# Patient Record
Sex: Male | Born: 1960 | Race: White | Hispanic: No | Marital: Married | Smoking: Never smoker
Health system: Southern US, Community
[De-identification: ages and names within clinical notes are randomized; demographics above are authoritative.]

## PROBLEM LIST (undated history)

## (undated) DIAGNOSIS — G5622 Lesion of ulnar nerve, left upper limb: Secondary | ICD-10-CM

## (undated) DIAGNOSIS — Q873 Congenital malformation syndromes involving early overgrowth: Secondary | ICD-10-CM

## (undated) DIAGNOSIS — E785 Hyperlipidemia, unspecified: Secondary | ICD-10-CM

## (undated) DIAGNOSIS — I2510 Atherosclerotic heart disease of native coronary artery without angina pectoris: Secondary | ICD-10-CM

## (undated) DIAGNOSIS — I10 Essential (primary) hypertension: Secondary | ICD-10-CM

## (undated) DIAGNOSIS — G5600 Carpal tunnel syndrome, unspecified upper limb: Secondary | ICD-10-CM

## (undated) HISTORY — DX: Lesion of ulnar nerve, left upper limb: G56.22

## (undated) HISTORY — DX: Atherosclerotic heart disease of native coronary artery without angina pectoris: I25.10

## (undated) HISTORY — DX: Other disorders of bilirubin metabolism: E80.6

## (undated) HISTORY — DX: Carpal tunnel syndrome, unspecified upper limb: G56.00

## (undated) HISTORY — DX: Essential (primary) hypertension: I10

## (undated) HISTORY — PX: CAROTID STENT: SHX1301

## (undated) HISTORY — DX: Gilbert syndrome: E80.4

## (undated) HISTORY — DX: Congenital malformation syndromes involving early overgrowth: Q87.3

## (undated) HISTORY — DX: Hyperlipidemia, unspecified: E78.5

---

## 2003-12-27 ENCOUNTER — Inpatient Hospital Stay (HOSPITAL_COMMUNITY): Admission: EM | Admit: 2003-12-27 | Discharge: 2003-12-31 | Payer: Self-pay | Admitting: Emergency Medicine

## 2003-12-30 ENCOUNTER — Encounter (INDEPENDENT_AMBULATORY_CARE_PROVIDER_SITE_OTHER): Payer: Self-pay | Admitting: *Deleted

## 2004-01-20 ENCOUNTER — Encounter (HOSPITAL_COMMUNITY): Admission: RE | Admit: 2004-01-20 | Discharge: 2004-04-19 | Payer: Self-pay | Admitting: *Deleted

## 2004-06-11 ENCOUNTER — Ambulatory Visit: Payer: Self-pay | Admitting: Internal Medicine

## 2004-09-10 ENCOUNTER — Ambulatory Visit: Payer: Self-pay | Admitting: Cardiology

## 2004-09-19 ENCOUNTER — Emergency Department (HOSPITAL_COMMUNITY): Admission: EM | Admit: 2004-09-19 | Discharge: 2004-09-19 | Payer: Self-pay | Admitting: Emergency Medicine

## 2004-12-17 ENCOUNTER — Ambulatory Visit: Payer: Self-pay | Admitting: Internal Medicine

## 2005-03-15 ENCOUNTER — Ambulatory Visit: Payer: Self-pay | Admitting: Cardiology

## 2009-04-20 ENCOUNTER — Emergency Department (HOSPITAL_COMMUNITY): Admission: EM | Admit: 2009-04-20 | Discharge: 2009-04-20 | Payer: Self-pay | Admitting: Emergency Medicine

## 2009-06-03 ENCOUNTER — Ambulatory Visit (HOSPITAL_COMMUNITY): Admission: RE | Admit: 2009-06-03 | Discharge: 2009-06-03 | Payer: Self-pay | Admitting: General Surgery

## 2009-06-03 ENCOUNTER — Encounter (INDEPENDENT_AMBULATORY_CARE_PROVIDER_SITE_OTHER): Payer: Self-pay | Admitting: General Surgery

## 2010-11-19 LAB — CBC
HCT: 39.2 % (ref 39.0–52.0)
Hemoglobin: 13.7 g/dL (ref 13.0–17.0)
MCHC: 34.8 g/dL (ref 30.0–36.0)
MCV: 94.5 fL (ref 78.0–100.0)
Platelets: 183 10*3/uL (ref 150–400)
RBC: 4.15 MIL/uL — ABNORMAL LOW (ref 4.22–5.81)
RDW: 13.1 % (ref 11.5–15.5)
WBC: 6.8 10*3/uL (ref 4.0–10.5)

## 2010-11-19 LAB — DIFFERENTIAL
Basophils Absolute: 0 10*3/uL (ref 0.0–0.1)
Basophils Relative: 1 % (ref 0–1)
Eosinophils Absolute: 0.1 10*3/uL (ref 0.0–0.7)
Eosinophils Relative: 2 % (ref 0–5)
Lymphocytes Relative: 31 % (ref 12–46)
Lymphs Abs: 2.1 10*3/uL (ref 0.7–4.0)
Monocytes Absolute: 0.5 10*3/uL (ref 0.1–1.0)
Monocytes Relative: 8 % (ref 3–12)
Neutro Abs: 4 10*3/uL (ref 1.7–7.7)
Neutrophils Relative %: 58 % (ref 43–77)

## 2010-11-19 LAB — COMPREHENSIVE METABOLIC PANEL
ALT: 34 U/L (ref 0–53)
AST: 28 U/L (ref 0–37)
Albumin: 4.1 g/dL (ref 3.5–5.2)
Alkaline Phosphatase: 47 U/L (ref 39–117)
BUN: 14 mg/dL (ref 6–23)
CO2: 30 mEq/L (ref 19–32)
Calcium: 9.4 mg/dL (ref 8.4–10.5)
Chloride: 105 mEq/L (ref 96–112)
Creatinine, Ser: 1.09 mg/dL (ref 0.4–1.5)
GFR calc Af Amer: >60 mL/min (ref >60)
GFR calc non Af Amer: >60 mL/min (ref >60)
Glucose, Bld: 85 mg/dL (ref 70–99)
Potassium: 4.4 mEq/L (ref 3.5–5.1)
Sodium: 140 mEq/L (ref 135–145)
Total Bilirubin: 1.9 mg/dL — ABNORMAL HIGH (ref 0.3–1.2)
Total Protein: 6.2 g/dL (ref 6.0–8.3)

## 2010-11-20 LAB — DIFFERENTIAL
Basophils Absolute: 0 10*3/uL (ref 0.0–0.1)
Basophils Relative: 0 % (ref 0–1)
Eosinophils Absolute: 0 10*3/uL (ref 0.0–0.7)
Eosinophils Relative: 0 % (ref 0–5)
Lymphocytes Relative: 9 % — ABNORMAL LOW (ref 12–46)
Lymphs Abs: 1.1 10*3/uL (ref 0.7–4.0)
Monocytes Absolute: 0.4 10*3/uL (ref 0.1–1.0)
Monocytes Relative: 3 % (ref 3–12)
Neutro Abs: 11.1 10*3/uL — ABNORMAL HIGH (ref 1.7–7.7)
Neutrophils Relative %: 88 % — ABNORMAL HIGH (ref 43–77)

## 2010-11-20 LAB — COMPREHENSIVE METABOLIC PANEL
ALT: 41 U/L (ref 0–53)
AST: 30 U/L (ref 0–37)
Albumin: 4 g/dL (ref 3.5–5.2)
Alkaline Phosphatase: 44 U/L (ref 39–117)
BUN: 9 mg/dL (ref 6–23)
CO2: 25 mEq/L (ref 19–32)
Calcium: 9 mg/dL (ref 8.4–10.5)
Chloride: 103 mEq/L (ref 96–112)
Creatinine, Ser: 0.9 mg/dL (ref 0.4–1.5)
GFR calc Af Amer: >60 mL/min (ref >60)
GFR calc non Af Amer: >60 mL/min (ref >60)
Glucose, Bld: 125 mg/dL — ABNORMAL HIGH (ref 70–99)
Potassium: 3.4 mEq/L — ABNORMAL LOW (ref 3.5–5.1)
Sodium: 138 mEq/L (ref 135–145)
Total Bilirubin: 2 mg/dL — ABNORMAL HIGH (ref 0.3–1.2)
Total Protein: 6.8 g/dL (ref 6.0–8.3)

## 2010-11-20 LAB — CBC
HCT: 42.4 % (ref 39.0–52.0)
Hemoglobin: 14.4 g/dL (ref 13.0–17.0)
MCHC: 34.1 g/dL (ref 30.0–36.0)
MCV: 94.4 fL (ref 78.0–100.0)
Platelets: 200 10*3/uL (ref 150–400)
RBC: 4.49 MIL/uL (ref 4.22–5.81)
RDW: 12.9 % (ref 11.5–15.5)
WBC: 12.6 10*3/uL — ABNORMAL HIGH (ref 4.0–10.5)

## 2010-11-20 LAB — URINE MICROSCOPIC-ADD ON

## 2010-11-20 LAB — URINALYSIS, ROUTINE W REFLEX MICROSCOPIC
Bilirubin Urine: NEGATIVE
Glucose, UA: NEGATIVE mg/dL
Ketones, ur: NEGATIVE mg/dL
Leukocytes, UA: NEGATIVE
Nitrite: NEGATIVE
Protein, ur: NEGATIVE mg/dL
Specific Gravity, Urine: 1.018 (ref 1.005–1.030)
Urobilinogen, UA: 0.2 mg/dL (ref 0.0–1.0)
pH: 5 (ref 5.0–8.0)

## 2010-11-20 LAB — LIPASE, BLOOD: Lipase: 18 U/L (ref 11–59)

## 2010-11-23 IMAGING — CT CT ABDOMEN W/ CM
2 of 5 series · 17 of 46 positions shown, 19 images · IV contrast (WATER & 120 ML OMNI 300)
Comparison: None

CT ABDOMEN

CLINICAL DATA: Mid abdominal pain

CT ABDOMEN AND PELVIS WITH CONTRAST
TECHNIQUE: Multidetector CT imaging of the abdomen and pelvis was
performed using the standard protocol following bolus
administration of intravenous contrast.
Contrast: 120 ml of Omnipaque

[Series 2: routine abdomen · axial · 0.83mm/px · z∈[-486,-36]mm · 14 of 102 slices shown, 16 images]
[im 6/102  soft-tissue]
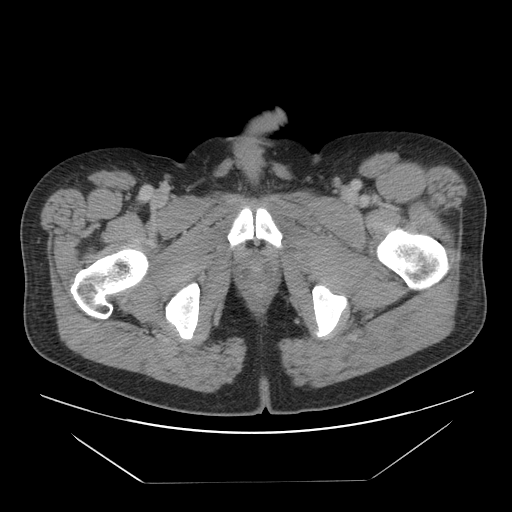
[im 6/102  bone]
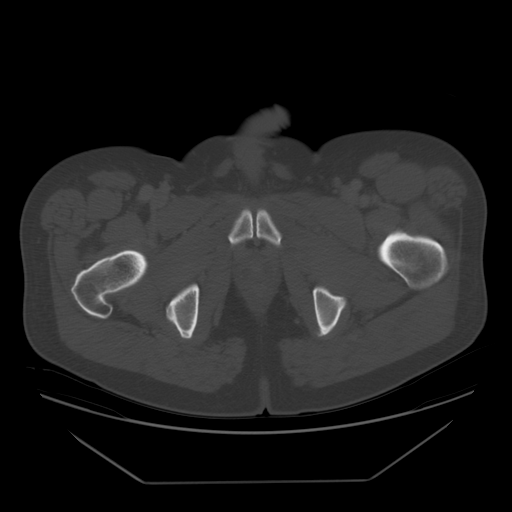
[im 11/102  soft-tissue]
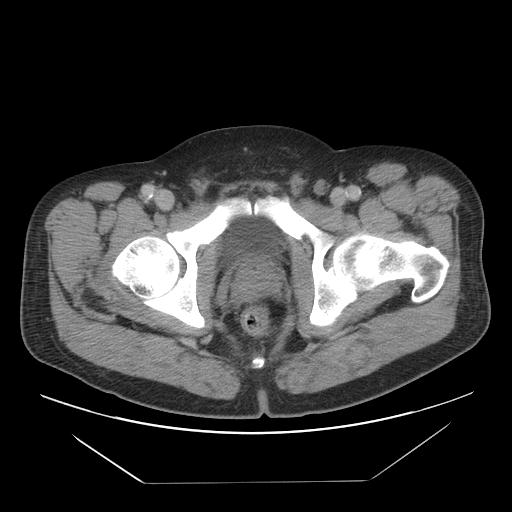
[im 22/102  soft-tissue]
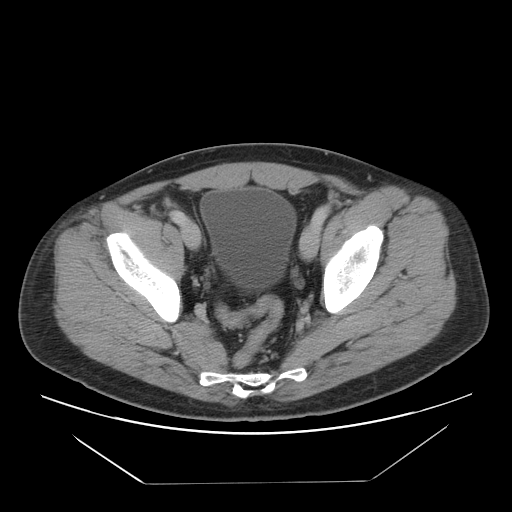
[im 27/102  soft-tissue]
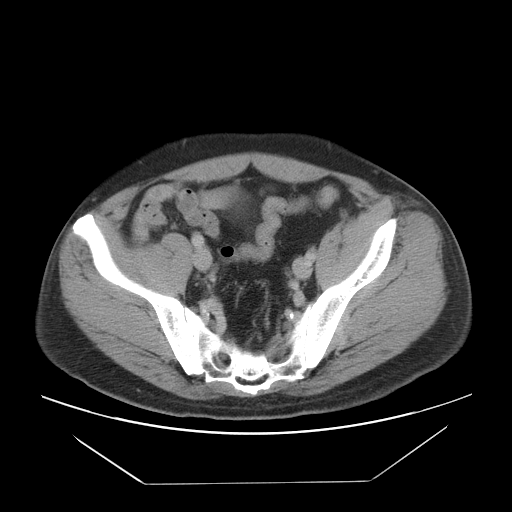
[im 32/102  soft-tissue]
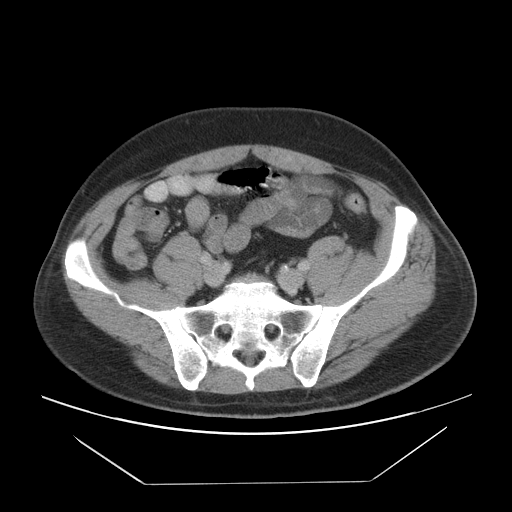
[im 43/102  soft-tissue]
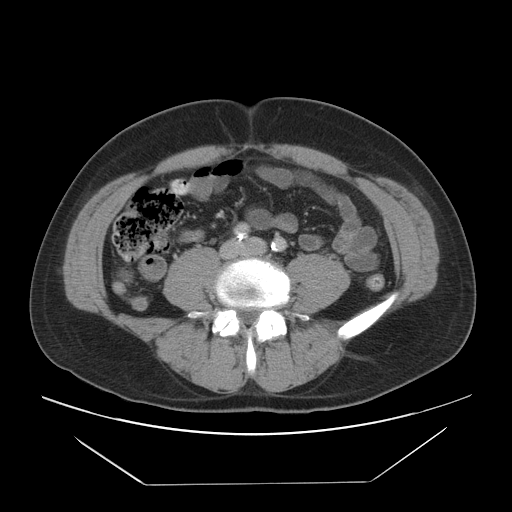
[im 48/102  soft-tissue]
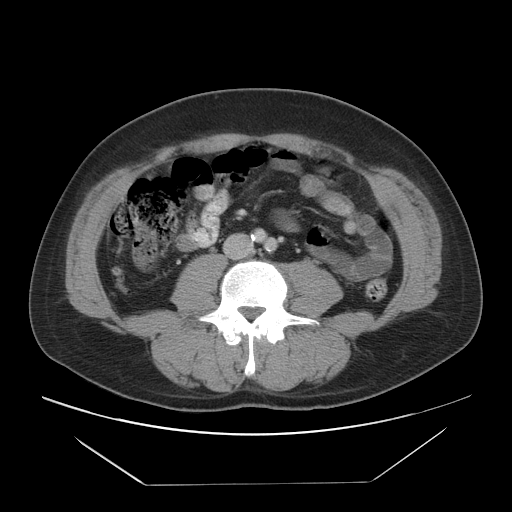
[im 54/102  soft-tissue]
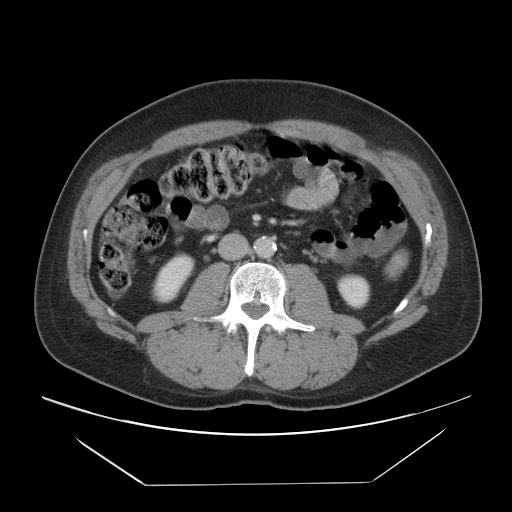
[im 59/102  soft-tissue]
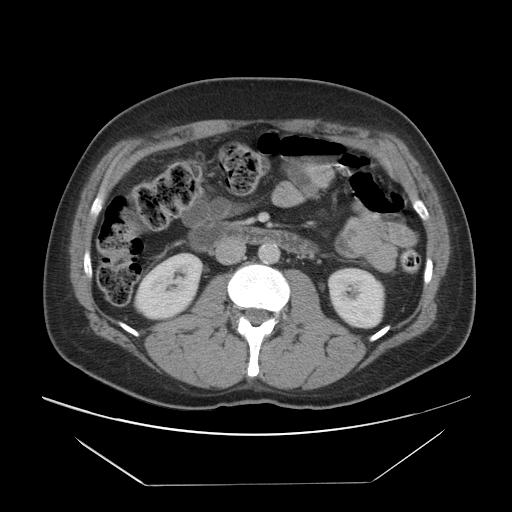
[im 59/102  bone]
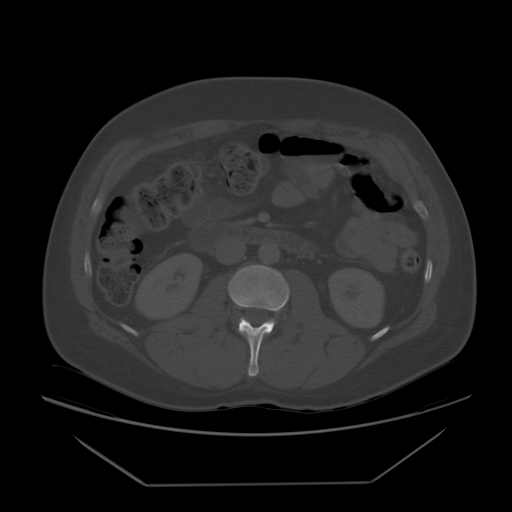
[im 70/102  soft-tissue]
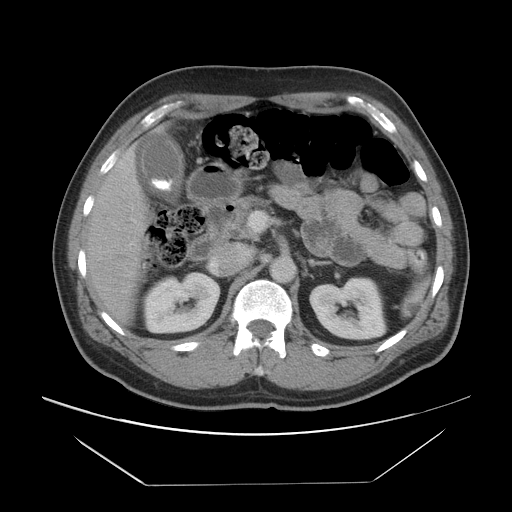
[im 75/102  soft-tissue]
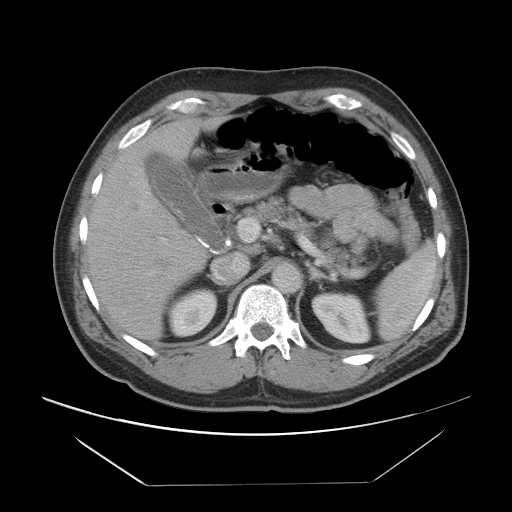
[im 80/102  soft-tissue]
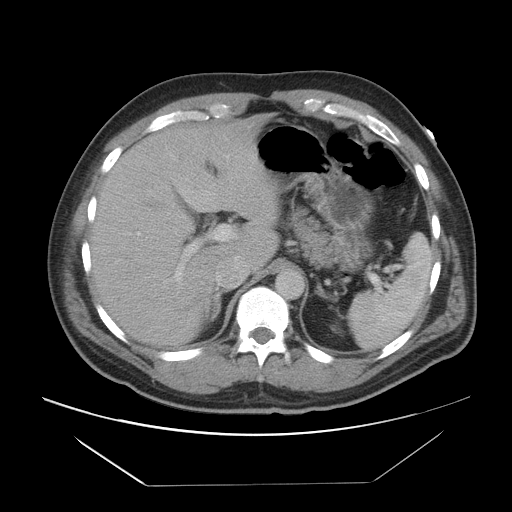
[im 91/102  soft-tissue]
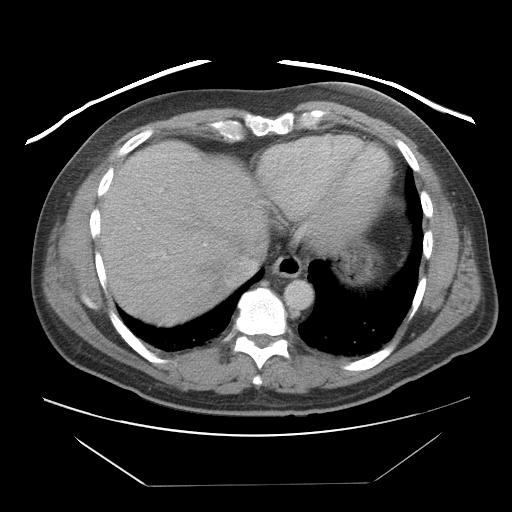
[im 96/102  soft-tissue]
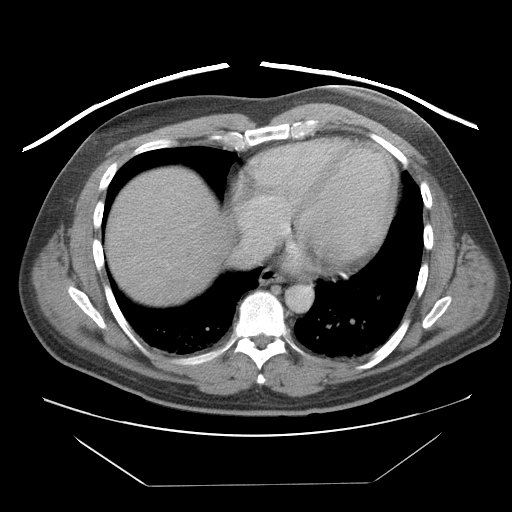

[Series 400: cor · coronal · 1.05mm/px · 3 of 107 slices shown]
[im 36/107  soft-tissue]
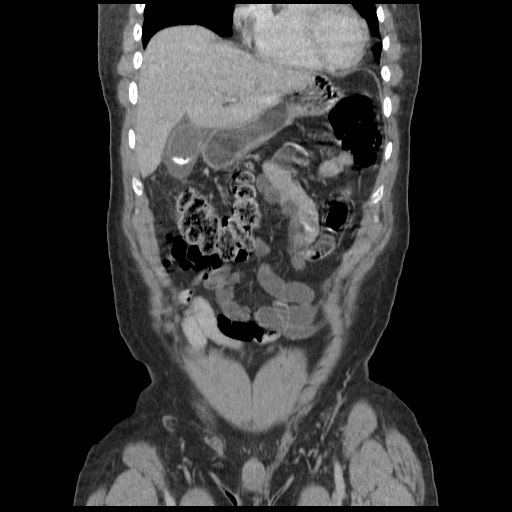
[im 48/107  soft-tissue]
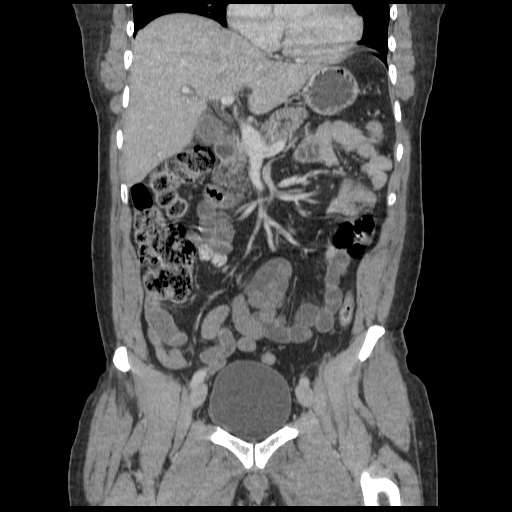
[im 59/107  soft-tissue]
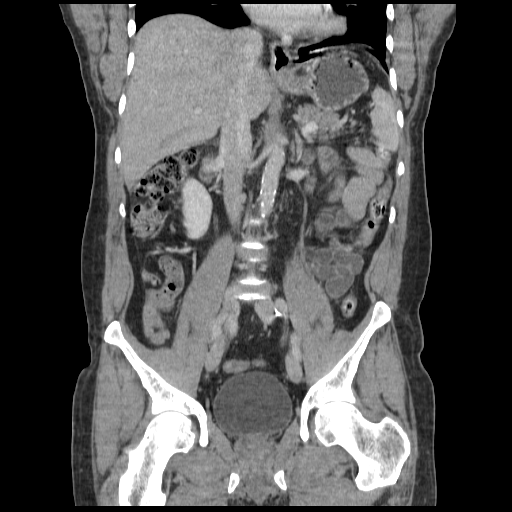

[17 of 46 positions shown; findings below may reference images not displayed]

FINDINGS: Lung bases are unremarkable.  Mild degenerative changes
lumbar spine.

Liver, spleen, pancreas and adrenals are unremarkable.  Mild
atherosclerotic calcifications of the abdominal aorta and iliac
arteries are noted.

Multiple calcified gallstones are noted within gallbladder.  A
calcified gallstone in the gallbladder neck region measures 4.3 mm.
There is significant thickening of the gallbladder wall up to
mm in thickness.  Mild enhancement of the gallbladder wall is
noted.  Findings are highly suspicious for calculous cholecystitis.
Clinical correlation is necessary.

No bowel obstruction.  No ascites or free air.  No adenopathy.

No hydronephrosis or hydroureter.  Kidneys show symmetrical size
and enhancement.  No focal renal mass.
IMPRESSION: 1.  Multiple calcified gallstones are noted within gallbladder.
There is thickening of the gallbladder wall.  A calcified gallstone
is noted in gallbladder neck region.  Findings are highly
suspicious for  calculous cholecystitis. Clinical correlation is
necessary.
2.  No hydronephrosis or hydroureter.
3.  Mild degenerative changes lumbar spine.

CT PELVIS
FINDINGS: A normal appendix is clearly visualized in axial image
56.  No pelvic ascites or adenopathy.  The urinary bladder is
unremarkable.  Bilateral distal ureter is unremarkable.  No
inguinal adenopathy.  No destructive bony lesions are seen within
pelvis.
IMPRESSION: 1.  Normal appendix.
2.  Normal urinary bladder.
3.  No pelvic ascites or adenopathy.

## 2011-01-01 IMAGING — CR DG CHEST 2V
2 series · 2 of 2 positions shown · non-contrast
Comparison: 12/27/2003

CLINICAL DATA: Preoperative evaluation for symptomatic
cholelithiasis.  Hypertension.  Nonsmoker history of stent
placement 5 years ago.

CHEST - 2 VIEW

[view not recorded (1 of 2)]
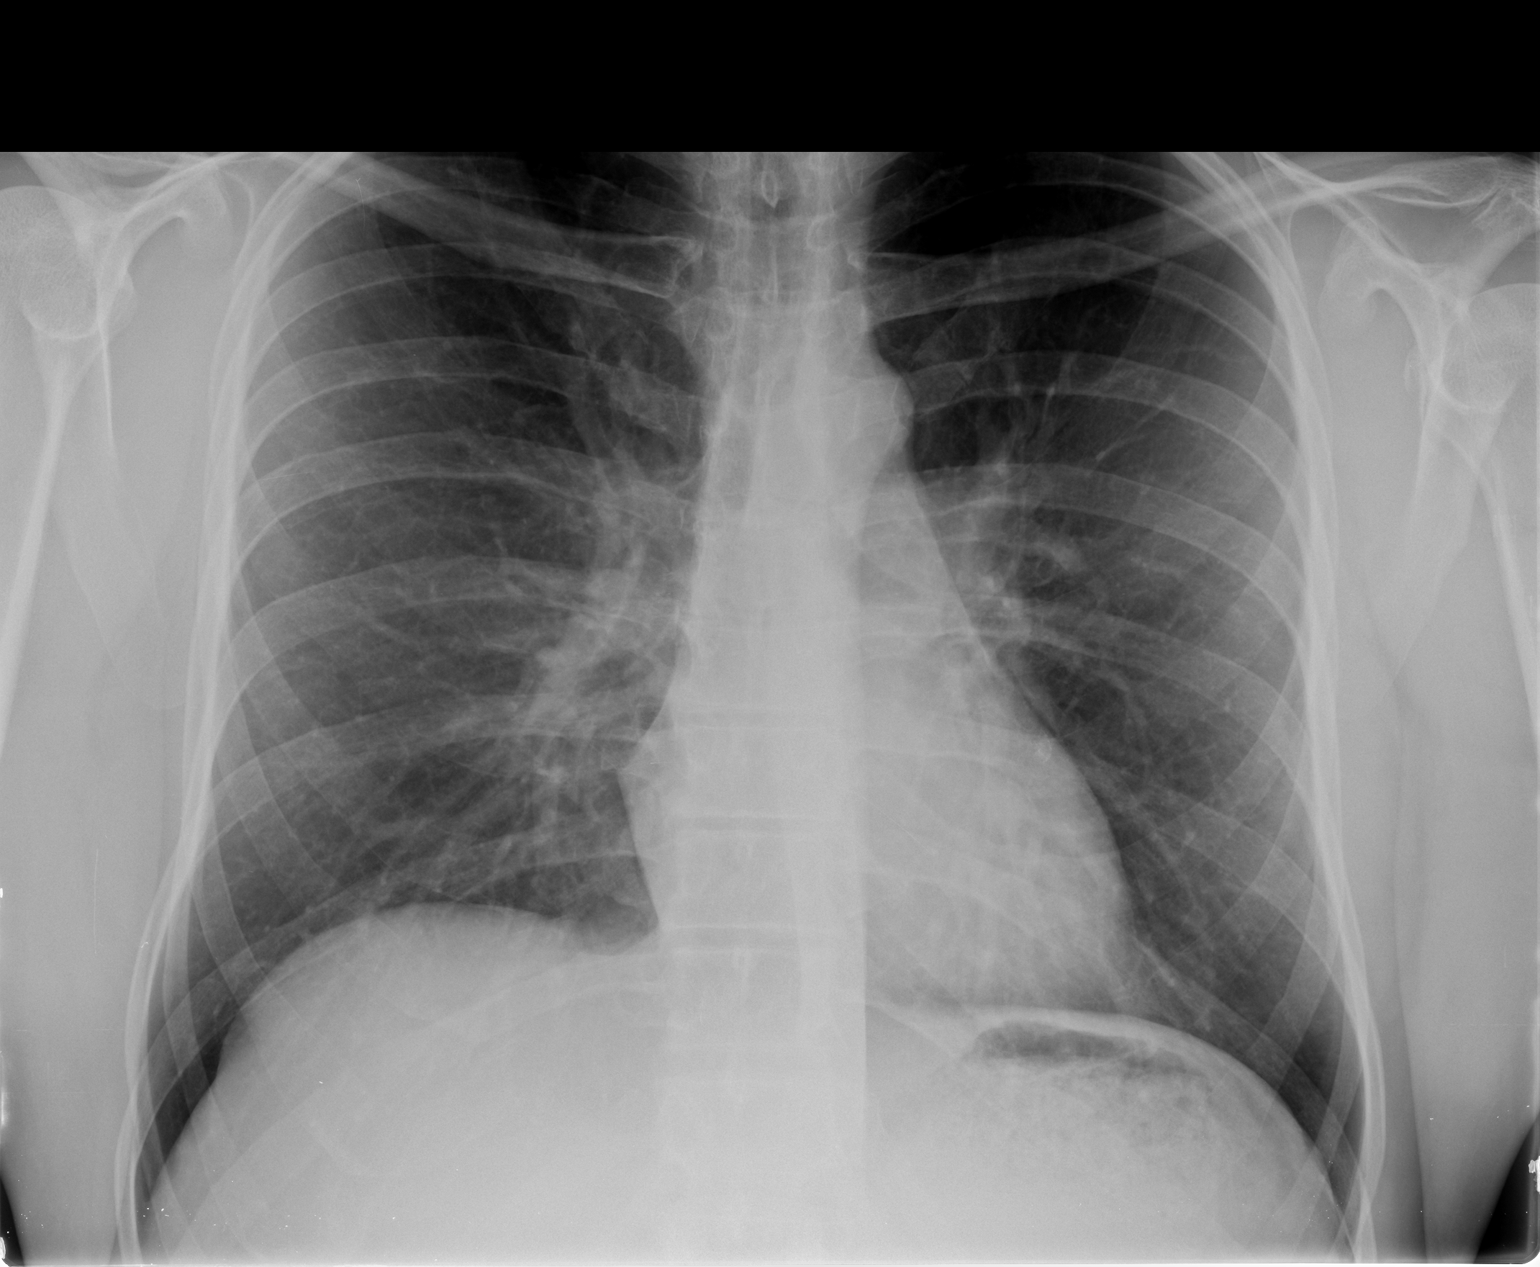

[view not recorded (2 of 2)]
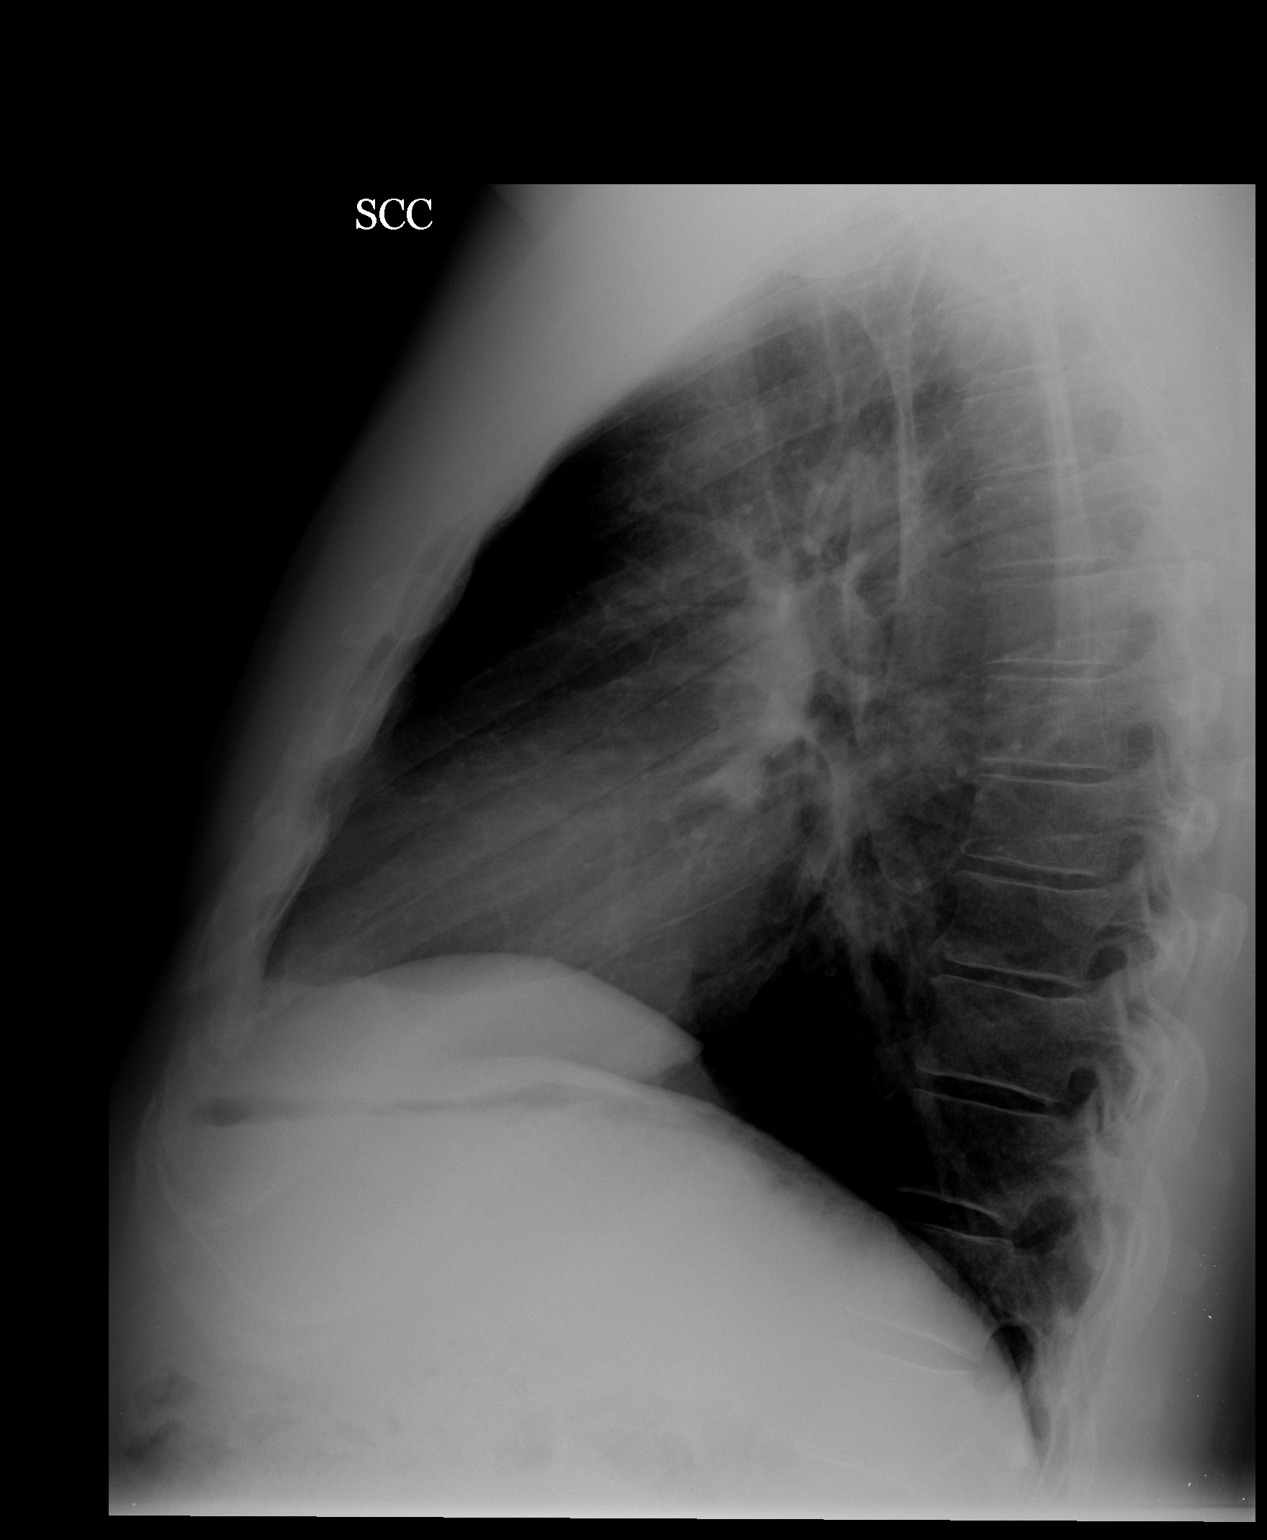

[2 of 2 positions shown; findings below may reference images not displayed]

FINDINGS: Heart and mediastinal contours are within normal limits.
The lung fields appear clear with no evidence for focal infiltrate
or congestive failure.  No pleural fluid is seen.  Bony structures
demonstrate mild degenerative changes of the lower lumbar spine and
are otherwise intact.
IMPRESSION: No acute cardiopulmonary abnormality noted.

## 2011-01-01 NOTE — Cardiovascular Report (Signed)
Francisco Randolph, Francisco Randolph NO.:  [id number]   MEDICAL RECORD NO.:  [id number]                   PATIENT TYPE:  INP   LOCATION:  2931                                 FACILITY:  [facility]   PHYSICIAN:  Meade Maw, M.D.                 DATE OF BIRTH:  30-Dec-1960   DATE OF PROCEDURE:  12/27/2003  DATE OF DISCHARGE:                              CARDIAC CATHETERIZATION   INDICATION FOR PROCEDURE:  Acute anteroapical myocardial infarction.   PROCEDURE:  The patient was brought emergently to the cardiac  catheterization lab.  Both groins were prepped and draped in the usual  sterile fashion.  Local anesthesia was achieved using 1% Xylocaine.  A 7  French hemostasis sheath was placed into the right femoral artery using the  modified Seldinger technique.  Selective coronary angiography was performed  using a JL-4, JR-4 Judkins catheter.  Multiple views were obtained.  All  catheter exchanges were made over guide wire.  Single plane ventriculogram  was performed in the RAO position using a 6 French pigtail curved catheter.  The patient was double bolused with integrilin.  ACT was obtained.  Dr.  Verdis Prime was consulted.  Films were reviewed.  Dr. Katrinka Blazing proceeded with  emergent percutaneous revascularization of the left anterior descending.   FINDINGS:  1. Aortic pressure was 141/89.  2. LV pressure 144/17.  3. EDP 34.  4. Single plane ventriculogram revealed anterior apical akinesis.  Estimated     ejection fraction 45%.   CORONARY ANGIOGRAPHY:  1. The left main coronary artery bifurcates.  There is no disease noted in     the left main coronary artery.  2. LAD:  LAD gives rise to two moderate size septal perforators.  It is then     100% totally occluded.  3. Circumflex vessel:  Circumflex vessel gives rise to a trivial OM-1,     trivial OM-2 and a large trifurcating OM-3.  It then goes on in as an AV     groove vessel.  There is luminal irregularities only in  the circumflex.  4. Right coronary artery:  Right coronary artery demonstrates diffuse     disease.  There is mild ectasia noted.  The most severe stenosis is noted     in the proximal and mid distal up to 30%.   FINAL IMPRESSION:  1. Acute anteroapical infarct.  Estimated ejection fraction 45%.  2. Total occlusion of the mid left anterior descending.  3. Diffuse disease in the right coronary artery of up to 30%.   RECOMMENDATIONS:  Percutaneous revascularization.                                               Meade Maw, M.D.    HP/MEDQ  D:  12/27/2003  T:  12/28/2003  Job:  [id number]

## 2011-01-01 NOTE — Cardiovascular Report (Signed)
NAMEFOUNTAIN, DERUSHA NO.:  [id number]   MEDICAL RECORD NO.:  [id number]                   PATIENT TYPE:  INP   LOCATION:  2931                                 FACILITY:  [facility]   PHYSICIAN:  Lesleigh Noe, M.D.            DATE OF BIRTH:  06/02/1961   DATE OF PROCEDURE:  12/27/2003  DATE OF DISCHARGE:                              CARDIAC CATHETERIZATION   INDICATIONS FOR PROCEDURE:  Acute anterior myocardial infarction with  totally occluded LAD.   DATE OF PROCEDURE:  Dec 27, 2003.   PROCEDURE PERFORMED:  LAD stent with Taxus drug-eluting stent.   DESCRIPTION:  Following Dr. Lindell Spar diagnostic evaluation during acute  infarction, a 7 French 3.5 cm left Judkins catheter was used to obtain  guiding support and shots.  A #4 left Judkins would not maintain ostium  intubation.  We tried at least 7 or 8 other catheters, but the 3.5 Judkins  left catheter was the best at maintaining some level of support to a wire to  allow catheter movement up and down the artery.  The patient received a  double bolus followed by an infusion of integrilin and also was given  approximately 7000 units of IV heparin during the procedure.  ACT was  documented during the procedure to be 240 seconds or greater and at the  completion of the procedure was 234 seconds.  We had difficulty crossing the  stenosis with guide wires because of significant angulation within the total  occlusion.  Wire manipulation led to reperfusion at around 2 p.m.  From that  point, we had still some difficulty with crossing the stenosis, but were  ultimately successful using an [employer] medium wire.  We also used an Marketing executive. Predilatation was performed with a 2.5 x 15-mm Maverick  balloon to 10 atmospheres.  We then deployed at 16 mm long x 3.0 mm Taxus  stent to 15 atmospheres.  Two inflations were performed.  We then performed  post dilatation with a 12 mm long x 3.25 mm Quantum to 14  atmospheres.  The  100% stenosis was reduced to 0% with TIMI-3 flow.  A diagonal involved in  the stenosis remained patent.  ACT post procedure 234 seconds.   A right femoral sheathogram was performed and Angio-Seal arteriotomy closure  was performed without difficulty.   CONCLUSION:  Successful stent of the mid left anterior descending with  reduction of stenosis from 100% to 0% with reestablishment of TIMI_3 flow.   PLAN:  Aspirin, Plavix or study drug.  The patient is enrolled in the [place]  study.  integrilin x18 hours.                                               Lesleigh Noe, M.D.  HWS/MEDQ  D:  12/27/2003  T:  12/28/2003  Job:  [id number]

## 2011-01-01 NOTE — Discharge Summary (Signed)
NAMERIGGS, Francisco NO.:  Randolph   MEDICAL RECORD NO.:  [id number]                   PATIENT TYPE:  INP   LOCATION:  4703                                 FACILITY:  [facility]   PHYSICIAN:  Meade Maw, M.D.                 DATE OF BIRTH:  1961-05-02   DATE OF ADMISSION:  12/27/2003  DATE OF DISCHARGE:  12/31/2003                                 DISCHARGE SUMMARY   HISTORY OF PRESENT ILLNESS/REASON FOR ADMISSION:  Francisco Randolph is a 50-year-  old male patient from [place] who began experiencing anterior chest pain  while mowing the grass two days prior to arrival.  He had another episode of  chest pain today while driving in his car described as a pressure sensation.  The previous day, he had actually mowed the grass without any additional  chest pain symptoms, and he works out regularly without any chest pain.  When he arrived at the ER, his chest pain was 3/10.  He was given sublingual  nitroglycerin and the pain went down to 0/10.  He recently moved to the  [place] area about two months ago from [place].  His primary care  physician is located in [place].   PAST MEDICAL HISTORY:  Significant for dyslipidemia, and he is currently on  Lipitor at home.   PHYSICAL EXAMINATION:  VITAL SIGNS:  Initial vital signs showed elevated BP  of 160/100, repeat was down to 129/87.  Heart rate 90, down to 82.  Respirations 18.  Saturations 94% on two liters.  Initial EKG showed sinus  rhythm with inferior and anterior ST segment elevation, 1-2 mm in 2, 3, aVF,  V2 to V4.  Physical examination was unremarkable.   LABORATORY DATA:  Chest x-ray and labs are pending at time of initial H&P.   ADMISSION DIAGNOSES:  The patient was admitted with the following diagnoses:  1. Acute anteroapical myocardial infarction, to catheterization lab     emergently.  2. Dyslipidemia.   HOSPITAL COURSE:  Problem #1.  Chest pain, acute anteroapical myocardial  infarction.  The patient was taken emergently to the catheterization lab on  Dec 27, 2003, per Dr. Fraser Din, at which time she found anterior apical  akinesis with an ejection fraction of 45%.  LAD was 100% occluded after the  diagonal 2, the circumflex 20, RCA showed mild ectasia, diffuse disease up  to 30% proximal mid vessel.  Films were reviewed by Dr. Katrinka Blazing, and the  patient on the same day underwent PCI with Taxus stent placement to the mid  LAD with subsequent TIMI 3 flow, obstruction down to 0%.  He was given  heparin and Integrilin in the immediate post-catheterization period.  Dr.  Kimberlee Nearing the patient to use the Triton study drug, and this was  initiated in lieu of Plavix.  The patient did have some episodes of chest  pressure without EKG changes in the  immediate post intervention period.  On  May 14, he was changed from Lopressor to [employer].  Altace was started at low-  dose, and homocystine level was checked.  Zocor was continued for his  dyslipidemia.  This was formulary for this facility.   Subsequent isoenzymes peaked at the following __________ 81.9, CK 2791, and  MB 274.8.  The following day, the patient remained stable, in sinus rhythm  without further episodes of chest pain.  Coreg was gently titrated up.  The  patient's lipid panel returned with a cholesterol of 231, triglycerides  112, LDL cholesterol highly elevated at 171, HDL suboptimal at 38.  Zetia  was added per Dr. Fraser Din.  Cardiac rehabilitation was started.  The patient  ambulated without problems.  No further chest pain or nausea.  A post-  catheterization 2-D echocardiogram was completed on Dec 30, 2003.  Unfortunately, I am unable to locate a copy of the echocardiogram in the  medical record chart.   Other problems the patient was having included mild elevated bilirubin.  This has been a chronic problem for 10 years.  Dr. Fraser Din recommend  recommended that the patient follow with his primary care  physician for  this.  He also had mild elevation in his homocystine level.  The homocystine  level was 13.91.  Because of his elevated bilirubin levels, Dr. Fraser Din was  reluctant to start Foltx on him at this point.   By Dec 31, 2003, the patient was quite stable.  Blood pressure was a little  low at 99/62.  Heart rate 79.  Some additional titration of Coreg and ACE  inhibitor were put on hold at this point.  Dr. Fraser Din otherwise deemed the  patient appropriate for discharge home with outpatient cardiac  rehabilitation followup.   FINAL DISCHARGE DIAGNOSES:  1. Acute anteroapical myocardial infarction status post Taxus stent mid left     anterior descending.  2. Dyslipidemia with suboptimal HDL and elevated LDL.  3. Ischemic cardiomyopathy with ejection fraction of 30 to 40%.  4. Mild elevation of homocystine level.  5. 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   DISCHARGE MEDICATIONS:  1. Triton study drug, no Plavix until cleared by study team.  2. Aspirin 325 mg daily.  3. Altace 2.5 mg daily.  4. Lipitor 40 mg daily at 6 p.m.  This is a new dose compared to prior to     admission.  5. Zetia 10 mg daily with Lipitor.  6. Coreg 6.25 mg b.i.d.  He has been given 12.5 mg tablets and been     instructed to take half of these twice daily.   DISCHARGE ACTIVITY:  1. No driving for the next two weeks.  2. Out of work for a total of six weeks from admission to the     hospitalization.   DISCHARGE DIET:  1. Cardiac, low-fat, low-salt, 2000 mg per day.  2. Low cholesterol.   FOLLOWUP:  1. He is to see Dr. Fraser Din on Tuesday, May 31, at 2:30 p.m.  2. He is to have liver function tests done on June 14, any time after 7:30     a.m.  3. He is to have a fasting statin panel done on June 28 at 7:30 a.m.  4. He is also to follow with his primary care physician about his elevated     bilirubin level.     Francisco Randolph, N.P.  Meade Maw, M.D.    ALE/MEDQ  D:   02/04/2004  T:  02/06/2004  Job:  [id number]

## 2014-10-25 ENCOUNTER — Encounter: Payer: Self-pay | Admitting: *Deleted

## 2014-10-30 ENCOUNTER — Ambulatory Visit (HOSPITAL_COMMUNITY): Payer: BLUE CROSS/BLUE SHIELD | Attending: Cardiovascular Disease | Admitting: *Deleted

## 2014-10-30 ENCOUNTER — Ambulatory Visit (INDEPENDENT_AMBULATORY_CARE_PROVIDER_SITE_OTHER): Payer: BLUE CROSS/BLUE SHIELD | Admitting: Interventional Cardiology

## 2014-10-30 ENCOUNTER — Encounter: Payer: Self-pay | Admitting: Interventional Cardiology

## 2014-10-30 VITALS — BP 124/70 | HR 54 | Ht 68.0 in | Wt 209.0 lb

## 2014-10-30 DIAGNOSIS — I2510 Atherosclerotic heart disease of native coronary artery without angina pectoris: Secondary | ICD-10-CM

## 2014-10-30 DIAGNOSIS — M7989 Other specified soft tissue disorders: Secondary | ICD-10-CM

## 2014-10-30 DIAGNOSIS — I252 Old myocardial infarction: Secondary | ICD-10-CM

## 2014-10-30 DIAGNOSIS — E785 Hyperlipidemia, unspecified: Secondary | ICD-10-CM

## 2014-10-30 MED ORDER — CLOPIDOGREL BISULFATE 75 MG PO TABS: 75.0000 mg | ORAL_TABLET | Freq: Every day | ORAL | Status: DC

## 2014-10-30 NOTE — Patient Instructions (Addendum)
Your physician has recommended you make the following change in your medication:  1)STOP taking Aspirin 2) START taking Plavix 75mg  daily   Your physician has requested that you have a lower extremity arterial exercise duplex. During this test, exercise and ultrasound are used to evaluate arterial blood flow in the legs. Allow one hour for this exam. There are no restrictions or special instructions.  Your physician wants you to follow-up in: 1 year with Dr. Eldridge DaceVaranasi. You will receive a reminder letter in the mail two months in advance. If you don't receive a letter, please call our office to schedule the follow-up appointment.

## 2014-10-30 NOTE — Progress Notes (Signed)
Patient ID: Francisco Randolph, male   DOB: [birth year], 54 y.o.   MRN: [id number]     Cardiology Office Note   Date:  10/30/2014   ID:  Francisco Randolph, DOB 1961-07-09, MRN [id number]  PCP:  Georgann Housekeeper, MD  Cardiologist:   Corky Crafts., MD   No chief complaint on file.  F/U CAD   History of Present Illness: Francisco Randolph is a 54 y.o. male who presents for CAD f/u.  He had an LAD drug  stent to the LAD in 2005 in the setting of an anterior MI.  He has done well since that time.  He had a stress test in 2009 which shoed some apical WMA and some mild periinfarct ischemia.  He has been off of Plavix fo rseveral years, tolerating aspirin.  He has been taking CoQ10.    He tries to walk or run.  He plays tennis and does weight training as well.  He does a lot of core exercises and stretching. He has improved on his diet.  He has lost 20 lbs in the past year.    He has noticed some right ankle swelling of late.  He had a right knee injury and then noticed the right calf swelling.  No long car rides or plane trips.    Past Medical History  Diagnosis Date  . CAD (coronary artery disease)   . HTN (hypertension)   . Dyslipidemia   . [place] Dec Agers syndrome   . CTS (carpal tunnel syndrome)   . Hyperbilirubinemia   . Gilbert's syndrome   . Entrapment of left ulnar nerve     Past Surgical History  Procedure Laterality Date  . Carotid stent       Current Outpatient Prescriptions  Medication Sig Dispense Refill  . atorvastatin (LIPITOR) 80 MG tablet Take 80 mg by mouth daily.    . carvedilol (COREG) 12.5 MG tablet Take 12.5 mg by mouth 2 (two) times daily with a meal.    . Coenzyme Q10 (CO Q-10) 100 MG CAPS Take 1 tablet by mouth daily.    . ramipril (ALTACE) 10 MG capsule Take 10 mg by mouth daily.    . clopidogrel (PLAVIX) 75 MG tablet Take 1 tablet (75 mg total) by mouth daily. 30 tablet 11   No current facility-administered medications for this visit.    Allergies:    Review of patient's allergies indicates no known allergies.    Social History:  The patient  reports that he has never smoked. He does not have any smokeless tobacco history on file.   Family History:  The patient's *family history is not on file.    ROS:  Please see the history of present illness.   Otherwise, review of systems are positive for right leg selling.   All other systems are reviewed and negative.    PHYSICAL EXAM: VS:  BP 124/70 mmHg  Pulse 54  Ht  (1.727 m)  Wt 209 lb (94.802 kg)  BMI 31.79 kg/m2 , BMI Body mass index is 31.79 kg/(m^2). GEN: Well nourished, well developed, in no acute distress HEENT: normal Neck: no JVD, carotid bruits, or masses Cardiac: RRR; no murmurs, rubs, or gallops,no edema  Respiratory:  clear to auscultation bilaterally, normal work of breathing GI: soft, nontender, nondistended, + BS MS: no deformity or atrophy Skin: warm and dry, no rash, 1+ right pretibial and right ankle swelling Neuro:  Strength and sensation are intact Psych: euthymic mood, full affect   EKG:  EKG  is ordered today. The ekg ordered today demonstrates NSR, poor R wave progression   Recent Labs: No results found for requested labs within last 365 days.    Lipid Panel No results found for: CHOL, TRIG, HDL, CHOLHDL, VLDL, LDLCALC, LDLDIRECT    Wt Readings from Last 3 Encounters:  10/30/14 209 lb (94.802 kg)      Other studies Reviewed: Additional studies/ records that were reviewed today include: Office visit from Dr. Donette LarryHusain. Review of the above records demonstrates: No active cardiac issues   ASSESSMENT AND PLAN:  1.  CAD/history of anterior MI: He is doing well from a cardiac standpoint. Given that he had a first generation drug-eluting stent placed in the setting of an MI, will change his aspirin to clopidogrel 75 mg daily.  He has not had any bleeding problems.  2.  Decreased LV function noted on prior nuclear stress test. He is on ACE inhibitor  and beta blocker. He appears euvolemic. No symptoms of CHF.  3.  Hyperlipidemia: I don't have his most recent lipids. He says his LDL has been around 100. Ideally, would get his LDL to around 70. Consider adding Zetia 10 mg daily if his most recent lifestyle changes do not bring his LDL down to 70.  4.  Right leg swelling: This is an acute issue. Will check lower extremity Doppler to rule out DVT. Obviously, if he does have a DVT, this would change our strategy regarding the aspirin and Plavix.   Current medicines are reviewed at length with the patient today.  The patient does not have concerns regarding medicines.  The following changes have been made:  Change aspirin to Plavix  Labs/ tests ordered today include: Right lower extremity venous Doppler   Orders Placed This Encounter  Procedures  . EKG 12-Lead  . Lower Extremity Arterial Doppler Right     Disposition:   FU with me in one year   Delorise JacksonSigned, Dabney Dever S., MD  10/30/2014 11:12 AM    [facility] [address], [place], [place]  [address] Phone: [phone]; Fax: [phone]

## 2014-10-30 NOTE — Progress Notes (Signed)
Unilateral Lower Extremity Venous Duplex Exam - Right - Performed

## 2014-11-14 ENCOUNTER — Encounter: Payer: Self-pay | Admitting: *Deleted

## 2015-04-04 ENCOUNTER — Other Ambulatory Visit: Payer: Self-pay | Admitting: *Deleted

## 2015-04-04 MED ORDER — CLOPIDOGREL BISULFATE 75 MG PO TABS: 75.0000 mg | ORAL_TABLET | Freq: Every day | ORAL | Status: AC

## 2018-01-04 DIAGNOSIS — Z0000 Encounter for general adult medical examination without abnormal findings: Secondary | ICD-10-CM | POA: Diagnosis not present

## 2018-01-05 DIAGNOSIS — Z125 Encounter for screening for malignant neoplasm of prostate: Secondary | ICD-10-CM | POA: Diagnosis not present

## 2018-01-05 DIAGNOSIS — Z0000 Encounter for general adult medical examination without abnormal findings: Secondary | ICD-10-CM | POA: Diagnosis not present

## 2018-09-01 DIAGNOSIS — R7309 Other abnormal glucose: Secondary | ICD-10-CM | POA: Diagnosis not present

## 2018-09-01 DIAGNOSIS — Z0000 Encounter for general adult medical examination without abnormal findings: Secondary | ICD-10-CM | POA: Diagnosis not present

## 2018-09-01 DIAGNOSIS — E7800 Pure hypercholesterolemia, unspecified: Secondary | ICD-10-CM | POA: Diagnosis not present

## 2018-09-14 DIAGNOSIS — L918 Other hypertrophic disorders of the skin: Secondary | ICD-10-CM | POA: Diagnosis not present

## 2018-09-14 DIAGNOSIS — D2261 Melanocytic nevi of right upper limb, including shoulder: Secondary | ICD-10-CM | POA: Diagnosis not present

## 2018-09-14 DIAGNOSIS — D1801 Hemangioma of skin and subcutaneous tissue: Secondary | ICD-10-CM | POA: Diagnosis not present

## 2018-09-14 DIAGNOSIS — L821 Other seborrheic keratosis: Secondary | ICD-10-CM | POA: Diagnosis not present

## 2019-02-28 DIAGNOSIS — E7800 Pure hypercholesterolemia, unspecified: Secondary | ICD-10-CM | POA: Diagnosis not present

## 2019-02-28 DIAGNOSIS — Z131 Encounter for screening for diabetes mellitus: Secondary | ICD-10-CM | POA: Diagnosis not present

## 2019-02-28 DIAGNOSIS — I10 Essential (primary) hypertension: Secondary | ICD-10-CM | POA: Diagnosis not present

## 2019-02-28 DIAGNOSIS — I252 Old myocardial infarction: Secondary | ICD-10-CM | POA: Diagnosis not present

## 2019-02-28 DIAGNOSIS — Z1389 Encounter for screening for other disorder: Secondary | ICD-10-CM | POA: Diagnosis not present

## 2019-02-28 DIAGNOSIS — Z0000 Encounter for general adult medical examination without abnormal findings: Secondary | ICD-10-CM | POA: Diagnosis not present

## 2019-02-28 DIAGNOSIS — Z125 Encounter for screening for malignant neoplasm of prostate: Secondary | ICD-10-CM | POA: Diagnosis not present

## 2019-02-28 DIAGNOSIS — I2510 Atherosclerotic heart disease of native coronary artery without angina pectoris: Secondary | ICD-10-CM | POA: Diagnosis not present

## 2019-10-09 DIAGNOSIS — D1801 Hemangioma of skin and subcutaneous tissue: Secondary | ICD-10-CM | POA: Diagnosis not present

## 2019-10-09 DIAGNOSIS — L438 Other lichen planus: Secondary | ICD-10-CM | POA: Diagnosis not present

## 2019-10-09 DIAGNOSIS — L821 Other seborrheic keratosis: Secondary | ICD-10-CM | POA: Diagnosis not present

## 2019-10-09 DIAGNOSIS — D225 Melanocytic nevi of trunk: Secondary | ICD-10-CM | POA: Diagnosis not present

## 2019-10-09 DIAGNOSIS — Z419 Encounter for procedure for purposes other than remedying health state, unspecified: Secondary | ICD-10-CM | POA: Diagnosis not present

## 2020-02-29 DIAGNOSIS — E7800 Pure hypercholesterolemia, unspecified: Secondary | ICD-10-CM | POA: Diagnosis not present

## 2020-02-29 DIAGNOSIS — J309 Allergic rhinitis, unspecified: Secondary | ICD-10-CM | POA: Diagnosis not present

## 2020-02-29 DIAGNOSIS — Z0000 Encounter for general adult medical examination without abnormal findings: Secondary | ICD-10-CM | POA: Diagnosis not present

## 2020-02-29 DIAGNOSIS — Z23 Encounter for immunization: Secondary | ICD-10-CM | POA: Diagnosis not present

## 2020-02-29 DIAGNOSIS — I2510 Atherosclerotic heart disease of native coronary artery without angina pectoris: Secondary | ICD-10-CM | POA: Diagnosis not present

## 2020-02-29 DIAGNOSIS — Z125 Encounter for screening for malignant neoplasm of prostate: Secondary | ICD-10-CM | POA: Diagnosis not present

## 2020-02-29 DIAGNOSIS — I10 Essential (primary) hypertension: Secondary | ICD-10-CM | POA: Diagnosis not present

## 2020-08-26 DIAGNOSIS — Z23 Encounter for immunization: Secondary | ICD-10-CM | POA: Diagnosis not present

## 2020-10-08 DIAGNOSIS — D225 Melanocytic nevi of trunk: Secondary | ICD-10-CM | POA: Diagnosis not present

## 2020-10-08 DIAGNOSIS — L814 Other melanin hyperpigmentation: Secondary | ICD-10-CM | POA: Diagnosis not present

## 2020-10-08 DIAGNOSIS — D2262 Melanocytic nevi of left upper limb, including shoulder: Secondary | ICD-10-CM | POA: Diagnosis not present

## 2020-10-08 DIAGNOSIS — D1801 Hemangioma of skin and subcutaneous tissue: Secondary | ICD-10-CM | POA: Diagnosis not present

## 2020-11-05 DIAGNOSIS — M1712 Unilateral primary osteoarthritis, left knee: Secondary | ICD-10-CM | POA: Diagnosis not present

## 2021-03-03 DIAGNOSIS — Z0000 Encounter for general adult medical examination without abnormal findings: Secondary | ICD-10-CM | POA: Diagnosis not present

## 2021-03-03 DIAGNOSIS — I252 Old myocardial infarction: Secondary | ICD-10-CM | POA: Diagnosis not present

## 2021-03-03 DIAGNOSIS — Z13220 Encounter for screening for lipoid disorders: Secondary | ICD-10-CM | POA: Diagnosis not present

## 2021-03-03 DIAGNOSIS — Z125 Encounter for screening for malignant neoplasm of prostate: Secondary | ICD-10-CM | POA: Diagnosis not present

## 2021-03-03 DIAGNOSIS — I10 Essential (primary) hypertension: Secondary | ICD-10-CM | POA: Diagnosis not present

## 2021-03-03 DIAGNOSIS — E7800 Pure hypercholesterolemia, unspecified: Secondary | ICD-10-CM | POA: Diagnosis not present

## 2021-03-20 DIAGNOSIS — S4351XA Sprain of right acromioclavicular joint, initial encounter: Secondary | ICD-10-CM | POA: Diagnosis not present

## 2022-09-21 DIAGNOSIS — I10 Essential (primary) hypertension: Secondary | ICD-10-CM | POA: Diagnosis not present

## 2022-09-21 DIAGNOSIS — I2510 Atherosclerotic heart disease of native coronary artery without angina pectoris: Secondary | ICD-10-CM | POA: Diagnosis not present

## 2022-09-21 DIAGNOSIS — E7800 Pure hypercholesterolemia, unspecified: Secondary | ICD-10-CM | POA: Diagnosis not present

## 2022-09-21 DIAGNOSIS — I252 Old myocardial infarction: Secondary | ICD-10-CM | POA: Diagnosis not present

## 2022-12-21 DIAGNOSIS — Z5181 Encounter for therapeutic drug level monitoring: Secondary | ICD-10-CM | POA: Diagnosis not present

## 2022-12-21 DIAGNOSIS — E7800 Pure hypercholesterolemia, unspecified: Secondary | ICD-10-CM | POA: Diagnosis not present
